# Patient Record
Sex: Male | Born: 1975 | Race: White | Hispanic: No | Marital: Married
Health system: Southern US, Community
[De-identification: ages and names within clinical notes are randomized; demographics above are authoritative.]

---

## 2018-10-16 ENCOUNTER — Emergency Department: Payer: Worker's Compensation

## 2018-10-16 ENCOUNTER — Emergency Department
Admission: EM | Admit: 2018-10-16 | Discharge: 2018-10-16 | Disposition: A | Discharge status: Home / Self Care | Payer: Worker's Compensation | Attending: Emergency Medicine | Admitting: Emergency Medicine

## 2018-10-16 ENCOUNTER — Other Ambulatory Visit: Payer: Self-pay

## 2018-10-16 ENCOUNTER — Encounter: Payer: Self-pay | Admitting: Emergency Medicine

## 2018-10-16 DIAGNOSIS — Y929 Unspecified place or not applicable: Secondary | ICD-10-CM | POA: Diagnosis not present

## 2018-10-16 DIAGNOSIS — W208XXA Other cause of strike by thrown, projected or falling object, initial encounter: Secondary | ICD-10-CM | POA: Diagnosis not present

## 2018-10-16 DIAGNOSIS — S0990XA Unspecified injury of head, initial encounter: Secondary | ICD-10-CM

## 2018-10-16 DIAGNOSIS — Y9389 Activity, other specified: Secondary | ICD-10-CM | POA: Diagnosis not present

## 2018-10-16 DIAGNOSIS — Y990 Civilian activity done for income or pay: Secondary | ICD-10-CM | POA: Diagnosis not present

## 2018-10-16 DIAGNOSIS — S0101XA Laceration without foreign body of scalp, initial encounter: Secondary | ICD-10-CM | POA: Insufficient documentation

## 2018-10-16 MED ORDER — ACETAMINOPHEN 500 MG PO TABS
1000.0000 mg | ORAL_TABLET | Freq: Once | ORAL | Status: AC
  Administered 2018-10-16: 1000 mg via ORAL
  Filled 2018-10-16: qty 2

## 2018-10-16 MED ORDER — LIDOCAINE-EPINEPHRINE-TETRACAINE (LET) SOLUTION
3.0000 mL | Freq: Once | NASAL | Status: AC
  Administered 2018-10-16: 3 mL via TOPICAL
  Filled 2018-10-16: qty 3

## 2018-10-16 NOTE — Discharge Instructions (Signed)
°  Do not get the stapled area wet for 24 hours. After 24 hours, shower/bathe as usual and pat the area dry. Apply antibiotic ointment 2 times per day. See your PCP or go to Urgent Care in 7 days for suture removal or sooner for signs or concern of infection.
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
